# Patient Record
Sex: Female | Born: 1965 | Race: Black or African American | Hispanic: No | Marital: Single | Smoking: Never smoker
Health system: Southern US, Community
[De-identification: ages and names within clinical notes are randomized; demographics above are authoritative.]

## PROBLEM LIST (undated history)

## (undated) DIAGNOSIS — J45909 Unspecified asthma, uncomplicated: Secondary | ICD-10-CM

## (undated) DIAGNOSIS — D649 Anemia, unspecified: Secondary | ICD-10-CM

## (undated) HISTORY — PX: LAPAROSCOPIC ABDOMINAL EXPLORATION: SHX6249

## (undated) HISTORY — DX: Anemia, unspecified: D64.9

## (undated) HISTORY — DX: Unspecified asthma, uncomplicated: J45.909

## (undated) HISTORY — PX: ABDOMINAL HYSTERECTOMY: SHX81

---

## 2004-02-18 ENCOUNTER — Observation Stay (HOSPITAL_COMMUNITY): Admission: RE | Admit: 2004-02-18 | Discharge: 2004-02-19 | Payer: Self-pay | Admitting: Obstetrics and Gynecology

## 2008-12-12 ENCOUNTER — Other Ambulatory Visit: Admission: RE | Admit: 2008-12-12 | Discharge: 2008-12-12 | Payer: Self-pay | Admitting: Obstetrics and Gynecology

## 2008-12-18 ENCOUNTER — Ambulatory Visit (HOSPITAL_COMMUNITY): Admission: RE | Admit: 2008-12-18 | Discharge: 2008-12-18 | Payer: Self-pay | Admitting: Obstetrics and Gynecology

## 2009-03-26 ENCOUNTER — Encounter: Payer: Self-pay | Admitting: Obstetrics and Gynecology

## 2009-03-26 ENCOUNTER — Inpatient Hospital Stay (HOSPITAL_COMMUNITY): Admission: RE | Admit: 2009-03-26 | Discharge: 2009-03-29 | Payer: Self-pay | Admitting: Obstetrics and Gynecology

## 2010-05-24 IMAGING — US US PELVIS COMPLETE MODIFY
1 series · 13 of 25 positions shown · non-contrast
Comparison: None

CLINICAL DATA: 14-week uterine size.  Increasingly heavy menses
with anemia.  Assess fibroids.  Previous left oophorectomy for
dermoid.  LMP currently.

TRANSABDOMINAL AND TRANSVAGINAL ULTRASOUND OF PELVIS
TECHNIQUE: Both transabdominal and transvaginal ultrasound
examinations of the pelvis were performed including evaluation of
the uterus, ovaries, adnexal regions, and pelvic cul-de-sac.

[Series 1: us pelvis complete modify · 0.26mm/px · 13 of 68 slices shown]
[im 1/68]
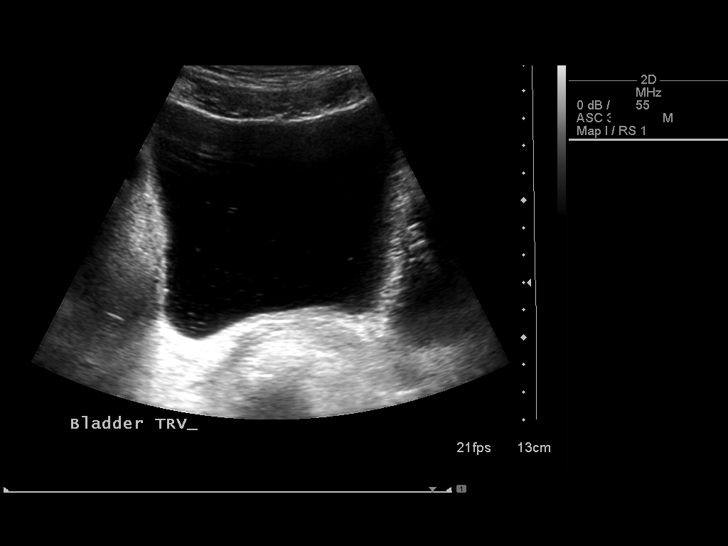
[im 6/68]
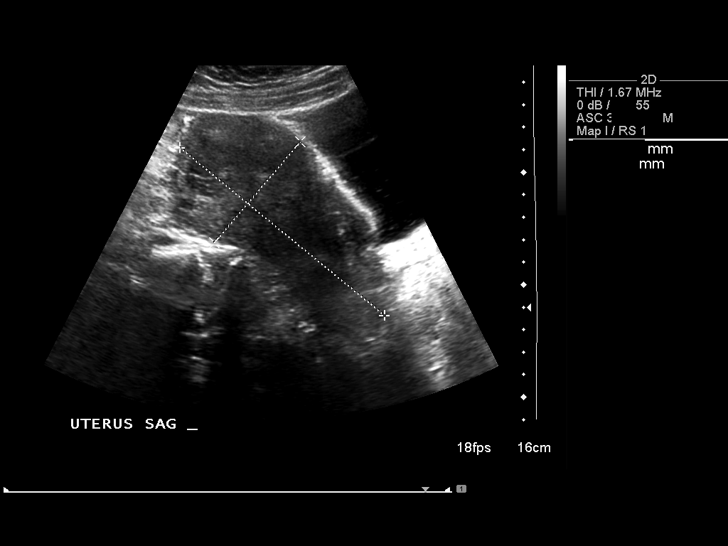
[im 12/68]
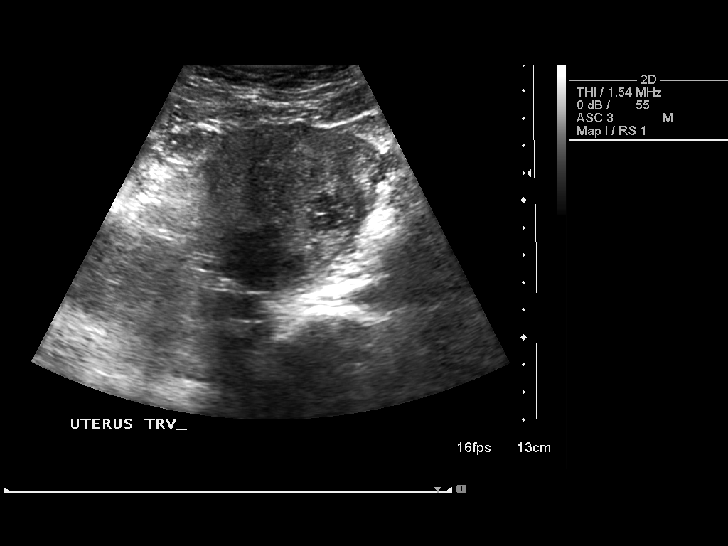
[im 17/68]
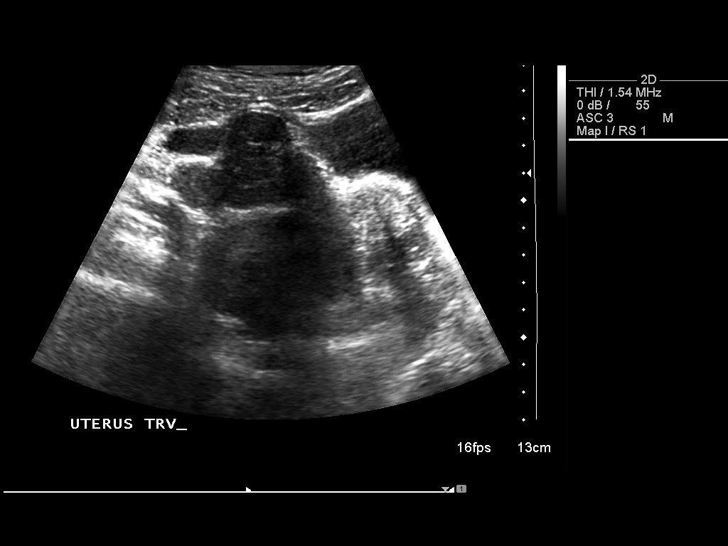
[im 23/68]
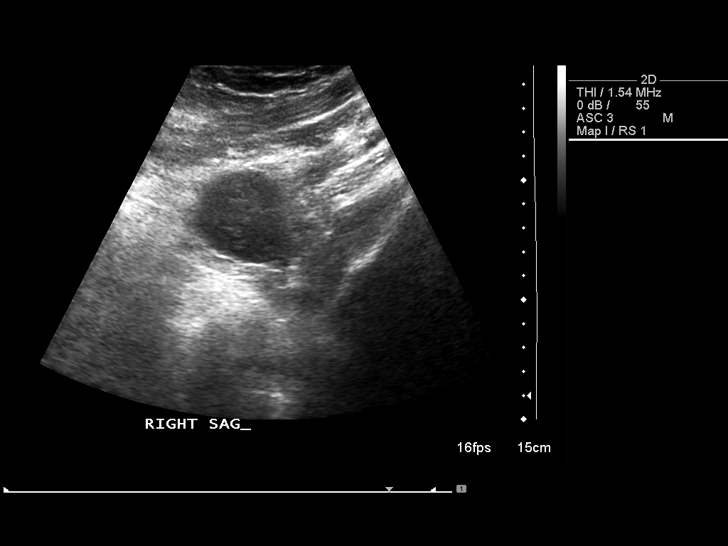
[im 28/68]
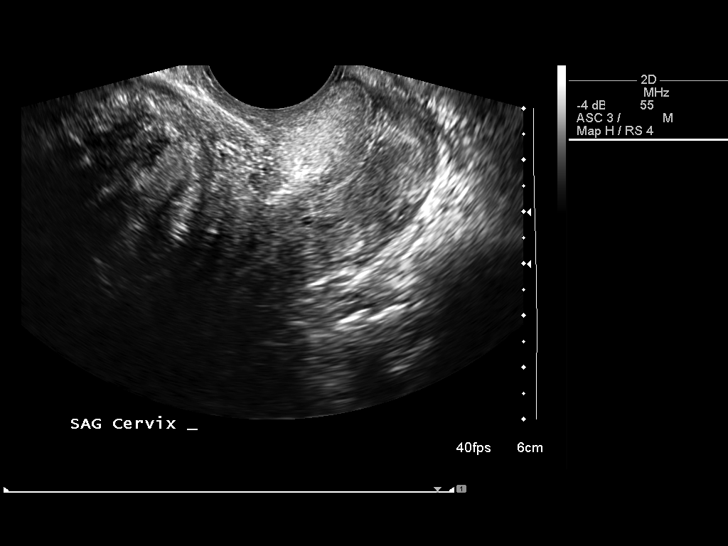
[im 34/68]
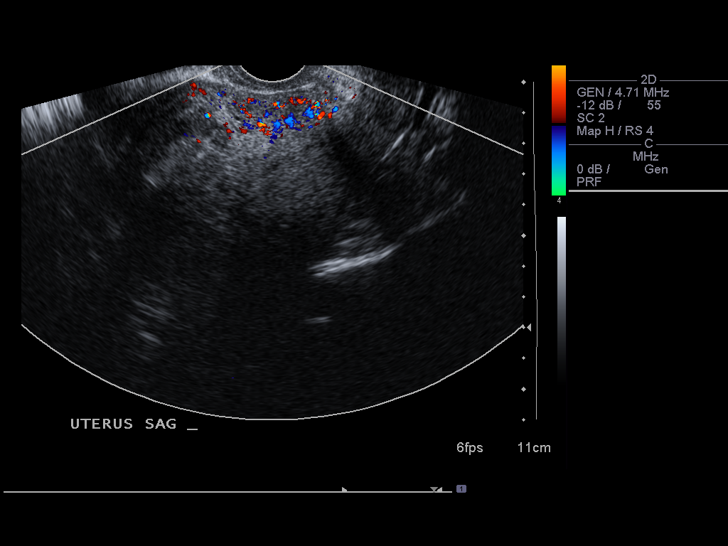
[im 40/68]
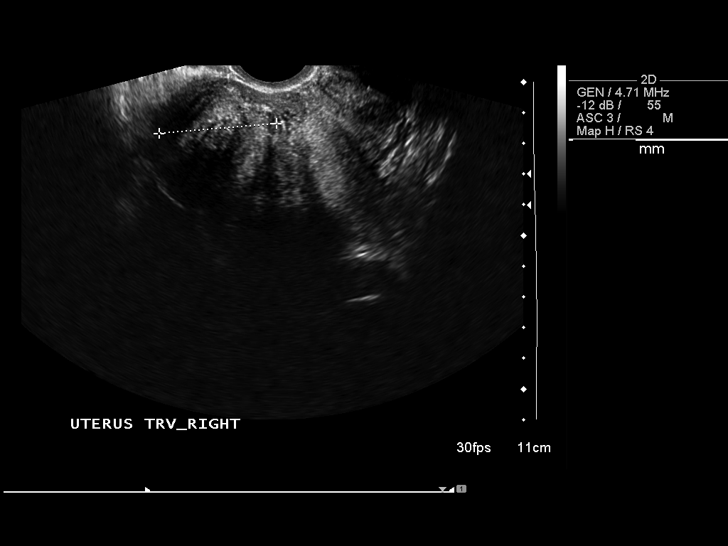
[im 45/68]
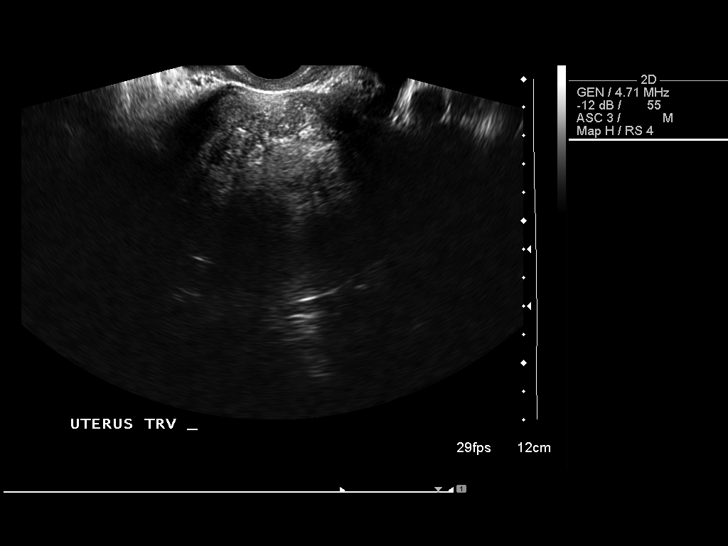
[im 51/68]
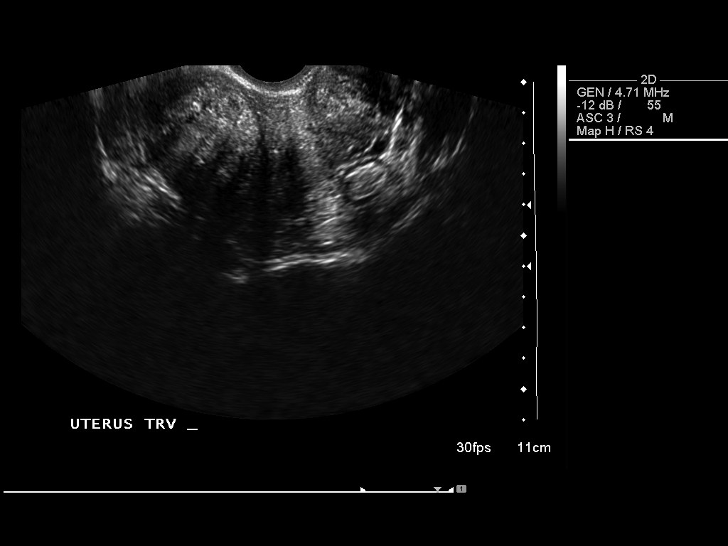
[im 56/68]
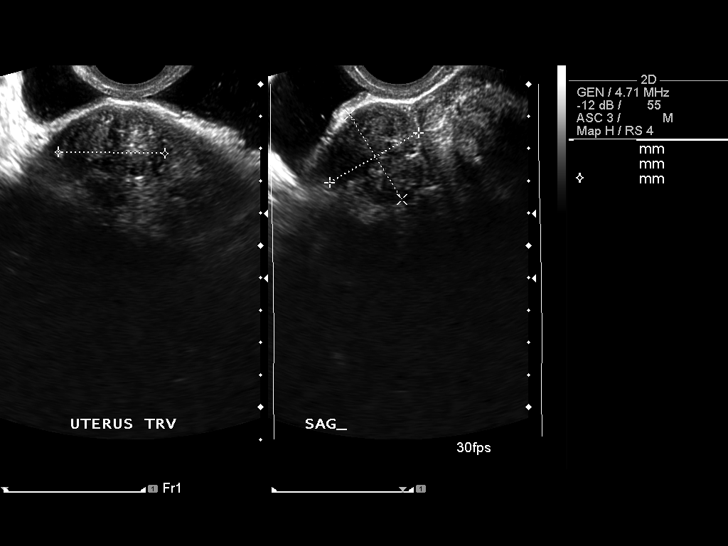
[im 62/68]
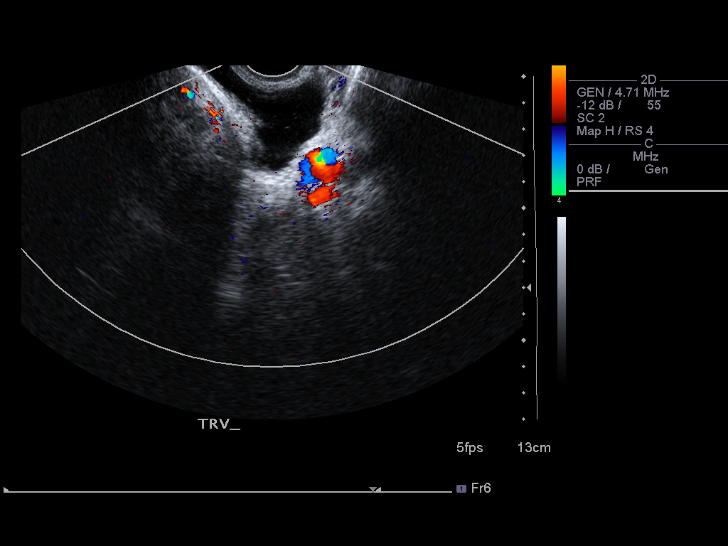
[im 68/68]
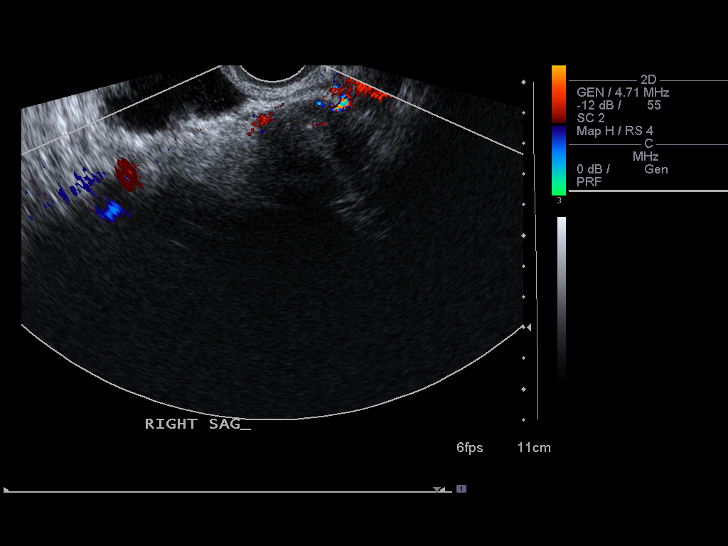

[13 of 25 positions shown; findings below may reference images not displayed]

FINDINGS: Uterus demonstrates a sagittal length of 12.7cm, an AP width of
cm and a transverse width of 9.5 cm. Multiple fibroids are
identified and the largest have sizes and locations as follows:  In
the anterior midbody with a partial subserosal component measuring
3.7 x 2.3 by 3.2 cm, in the left lateral lower uterine segment with
a partial sub serosal component measuring 3.8 x 3.0 x 3.0 cm, in
the right posterolateral upper uterine segment measuring 5.3 x
x 4.4 cm, in the anterior midbody measuring 3.2 x 3.1 x 3.3 cm.
Multiple other scattered fibroids are identified but  are difficult
to measure due to the large number present.

Endometrium poorly visualized due to the large fibroid load.
Accurate thickness and accurate delineation of the relationship
with the multiple fibroids cannot be determined on today's exam.

Right Ovary was not visualized either transabdominally or
endovaginally

Left Ovary surgically absent

Other Findings:  No pelvic fluid is seen.  Some specular echoes are
noted within the bladder and and this can be seen with
proteinaceous debris.  Clinical correlation is recommended
IMPRESSION: Fibroid uterus with fibroid sizes and locations as noted above.
There are multiple smaller fibroids identified scattered throughout
the myometrium and these cannot be accurately measured.
Endometrial lining not adequately assessed.  Non-visualized right
ovary; surgically absent left ovary.

Some echoes are identified within the fluid in the bladder and this
can be seen with proteinaceous material within the urine.  Clinical
correlation is recommended.

If further evaluation of the endometrial lining and its
relationship to the patient's fibroids is desired MRI would be
recommended for further assessment.

## 2010-06-08 LAB — CBC
MCV: 82.1 fL (ref 78.0–100.0)
RBC: 3.62 MIL/uL — ABNORMAL LOW (ref 3.87–5.11)
WBC: 7.3 10*3/uL (ref 4.0–10.5)

## 2010-06-08 LAB — BASIC METABOLIC PANEL
Chloride: 104 mEq/L (ref 96–112)
Creatinine, Ser: 0.73 mg/dL (ref 0.4–1.2)
GFR calc Af Amer: >60 mL/min (ref >60)
GFR calc non Af Amer: >60 mL/min (ref >60)
Potassium: 3.9 mEq/L (ref 3.5–5.1)

## 2010-06-08 LAB — DIFFERENTIAL
Eosinophils Absolute: 0.1 10*3/uL (ref 0.0–0.7)
Lymphocytes Relative: 19 % (ref 12–46)
Lymphs Abs: 1.4 10*3/uL (ref 0.7–4.0)
Monocytes Relative: 8 % (ref 3–12)
Neutro Abs: 5.2 10*3/uL (ref 1.7–7.7)
Neutrophils Relative %: 71 % (ref 43–77)

## 2010-06-23 LAB — CBC
HCT: 35 % — ABNORMAL LOW (ref 36.0–46.0)
Hemoglobin: 11.6 g/dL — ABNORMAL LOW (ref 12.0–15.0)
MCV: 80.7 fL (ref 78.0–100.0)
RBC: 4.34 MIL/uL (ref 3.87–5.11)
WBC: 4.5 10*3/uL (ref 4.0–10.5)

## 2010-06-23 LAB — CROSSMATCH
ABO/RH(D): O POS
Antibody Screen: NEGATIVE

## 2010-06-23 LAB — COMPREHENSIVE METABOLIC PANEL
Alkaline Phosphatase: 135 U/L — ABNORMAL HIGH (ref 39–117)
BUN: 8 mg/dL (ref 6–23)
CO2: 29 mEq/L (ref 19–32)
Chloride: 103 mEq/L (ref 96–112)
Creatinine, Ser: 0.74 mg/dL (ref 0.4–1.2)
GFR calc non Af Amer: >60 mL/min (ref >60)
Glucose, Bld: 79 mg/dL (ref 70–99)
Potassium: 3.5 mEq/L (ref 3.5–5.1)
Total Bilirubin: 0.6 mg/dL (ref 0.3–1.2)

## 2010-06-23 LAB — ABO/RH: ABO/RH(D): O POS

## 2010-08-08 NOTE — Op Note (Signed)
NAME:  Anna Logan, Anna Logan                ACCOUNT NO.:  [id number]   MEDICAL RECORD NO.:  [id number]          PATIENT TYPE:  OBV   LOCATION:  A403                          FACILITY:  [facility]   PHYSICIAN:  Tilda Burrow, M.D. DATE OF BIRTH:  February 22, 1966   DATE OF PROCEDURE:  DATE OF DISCHARGE:                                 OPERATIVE REPORT   PREOPERATIVE DIAGNOSIS:  Left lower quadrant pain, left ovarian cyst.   POSTOPERATIVE DIAGNOSIS:  Left hemorrhagic ovarian cyst (endometrioma), left  hydrosalpinx, left lower quadrant adhesions, status post tubal ligation.   PROCEDURE:  Laparoscopic left salpingo-oophorectomy and lysis of adhesions.   SURGEON:  Tilda Burrow, M.D.   ASSISTANTSimonne Come and Dearing.   ANESTHESIA:  General.   COMPLICATIONS:  None.   ESTIMATED BLOOD LOSS:  25 cc.   FINDINGS:  Thin, filmy adhesions around the appendix.  Sigmoid and omentum  attached at the site of longstanding prior tubal cautery.  A 12-week sized  uterus.  Multiple fibroids.  A large left ovarian hemorrhagic cyst and  endometrioma.  Dense adhesions under tension due to enlargement of the  uterus over time.   OPERATIVE TIME:  Around 20 minutes.   SPECIMENS:  Left tube and ovary.   DESCRIPTION OF PROCEDURE:  Patient was taken to the operating room and  prepped and draped for combined abdominal and vaginal procedure with Foley  catheter in place and Hulka tenaculum attached to the cervix for uterine  manipulation.  Latex was avoided.  The patient had an infraumbilical  vertical 1 cm skin incision made as well as a transverse suprapubic 1 cm  incision.  A Veress needle was introduced through the umbilicus.  A  pneumoperitoneum achieved easily under 11 mmHg through 2 liters CO2 with  direct insertion of laparoscopically guided umbilical trocar without  difficulty, an 11 mm trocar.  Inspection of the pelvis showed no evidence of  trauma or bleeding associated with exertion and then the  suprapubic trocar  was inserted under direct visualization with a 12 mm trocar.  Then  5 mm  trocars were placed in the right and left lower quadrants.  We then  proceeded to inspect the pelvis, where the adhesions in the right lower  quadrant around the old appendix were cut free to improve mobility.  Photo  of the appendix was taken.  The left side had omental attachments over the  site of the old tubal cautery, which was densely adherent to the back of the  thickened left round ligament.  Additionally, the uterus was drawn somewhat  to the side due to the dense adhesions.  The sigmoid colon was attached to  the side of the tubal cautery site as well.  All of this was densely  adherent and longstanding adhesions.  We were able to free up the omentum  and pull it away using unipolar cautery, as when safely available and  scissors for transection.  We were then able to inspect the bowel, and with  sharp scissor dissection, we were able to dissect the epiploic fat  attachments to the  scarred down tubal site and thereby free up the bowel.  At this time, we can visualize all the way around the adnexa.  Access was  somewhat challenging due to the enlarged fibroid uterus, but the side wall  was relatively clear.  We then proceeded to use a harmonic scalpel for most  of the remaining dissection.  Some challenges with the harmonic scalpel were  necessitated, moving to a hand-control device to get the harmonic scalpel to  work, but then we were able to dissect around the tube and ovary, removing  the left tube and ovary in one intact specimen.  Once this was free, we  placed an EndoCatch bag through the suprapubic site.  Before placing the  specimen in the bag, we were able to use a laparoscopic aspiration needle to  deflate the large hemorrhagic cyst, which drained 40 cc of old chocolate  blood from the ovary and then reduced its size.  Unipolar cautery was used  to drain the hydrosalpinx.  The  specimen was then placed in the EndoCatch  bag and taken out through the suprapubic site, which only required slight  enlargement with approximately 2 cm.  The trocar was reinserted, and  irrigation of the pelvis performed.  Photos were taken intermittently  throughout the procedure, a total of seven.  Hemostasis was confirmed to  satisfactory, and then irrigation of the pelvis copiously performed.  A  generous amount of fluid left in the abdomen.  Deflation of the abdomen and  removal of laparoscopic trocar sites was followed by closure of the  umbilical site at the level of the fascia using interrupted 0 Vicryl and  then closure of the suprapubic 2 cm incision with interrupted 0 Vicryl as  well.  Subcu tissues were loosely reapproximated and then stapling of the  skin completed the procedure.  Patient tolerated the procedure well and went  to the recovery room in good condition.  Sponge and needle counts were  correct, and stable closure of the skin had been performed.     John   JVF/MEDQ  D:  02/18/2004  T:  02/18/2004  Job:  [id number]   cc:   [facility]

## 2014-06-13 ENCOUNTER — Other Ambulatory Visit: Payer: Self-pay | Admitting: Obstetrics and Gynecology

## 2014-08-10 ENCOUNTER — Other Ambulatory Visit: Payer: Self-pay | Admitting: Obstetrics and Gynecology

## 2014-08-10 ENCOUNTER — Encounter: Payer: Self-pay | Admitting: Obstetrics and Gynecology

## 2015-01-17 ENCOUNTER — Other Ambulatory Visit: Payer: Self-pay | Admitting: Obstetrics and Gynecology

## 2015-01-17 ENCOUNTER — Encounter: Payer: Self-pay | Admitting: *Deleted

## 2015-06-17 ENCOUNTER — Other Ambulatory Visit: Payer: Self-pay | Admitting: Obstetrics and Gynecology

## 2015-07-12 ENCOUNTER — Other Ambulatory Visit: Payer: Self-pay | Admitting: Obstetrics and Gynecology

## 2015-07-12 ENCOUNTER — Encounter: Payer: Self-pay | Admitting: Obstetrics and Gynecology

## 2015-09-02 ENCOUNTER — Ambulatory Visit (INDEPENDENT_AMBULATORY_CARE_PROVIDER_SITE_OTHER): Payer: Managed Care, Other (non HMO) | Admitting: Obstetrics and Gynecology

## 2015-09-02 ENCOUNTER — Other Ambulatory Visit: Payer: Self-pay | Admitting: Obstetrics and Gynecology

## 2015-09-02 ENCOUNTER — Encounter: Payer: Self-pay | Admitting: Obstetrics and Gynecology

## 2015-09-02 VITALS — BP 170/112 | Ht 62.0 in | Wt 189.0 lb

## 2015-09-02 DIAGNOSIS — Z01419 Encounter for gynecological examination (general) (routine) without abnormal findings: Secondary | ICD-10-CM

## 2015-09-02 DIAGNOSIS — Z90721 Acquired absence of ovaries, unilateral: Principal | ICD-10-CM

## 2015-09-02 DIAGNOSIS — Z90710 Acquired absence of both cervix and uterus: Secondary | ICD-10-CM

## 2015-09-02 NOTE — Progress Notes (Signed)
Patient ID: Anna Logan, female   DOB: [birth year], 50 y.o.   MRN: [id number]   Assessment:  Annual Gyn Exam   Plan:  1. pap smear done, next pap due 3 years 2. return annually or prn 3    Annual mammogram advised 4    Fasted lab work within the next week, repeat every 3 years 5    Recommended Estrace prn to be used along with lubricant  6    Pt advised to sign up for MyChart  Subjective:  Anna Logan is a 50 y.o. female No obstetric history on file. who presents for annual exam. No LMP recorded. Patient has had a hysterectomy. Pt has no complaints today. Pt is not sexually active currently.  The following portions of the patient's history were reviewed and updated as appropriate: allergies, current medications, past family history, past medical history, past social history, past surgical history and problem list.  Past Medical History  Diagnosis Date  . Anemia   . Asthma     Past Surgical History  Procedure Laterality Date  . Abdominal hysterectomy    . Laparoscopic abdominal exploration       Current outpatient prescriptions:  .  Omega-3 Fatty Acids (FISH OIL) 500 MG CAPS, Take 2 capsules by mouth daily., Disp: , Rfl:  .  Phenylephrine-Acetaminophen 5-325 MG CAPS, Take 2 tablets by mouth as needed., Disp: , Rfl:   Review of Systems Constitutional: negative Gastrointestinal: negative Genitourinary: negative  Objective:  BP 170/112 mmHg  Ht 5\' 2"  (1.575 m)  Wt 189 lb (85.73 kg)  BMI 34.56 kg/m2   BMI: Body mass index is 34.56 kg/(m^2).  General Appearance: Alert, appropriate appearance for age. No acute distress HEENT: Grossly normal Neck / Thyroid:  Cardiovascular: RRR; normal S1, S2, no murmur Lungs: CTA bilaterally Back: No CVAT Breast Exam: No dimpling, nipple retraction or discharge. No masses or nodes. Good tissue support. Gastrointestinal: Soft, non-tender, no masses or organomegaly Pelvic Exam: Vaginal: normal mucosa without prolapse or lesions,  normal without tenderness, induration or masses and normal rugae; good anterior and posterior support Cervix: absent, removed surgically Adnexa: absent, removed surgically Uterus: absent, removed surgically Rectal: good sphincter tone, no masses and guaiac negative Lymphatic Exam: Non-palpable nodes in neck, clavicular, axillary, or inguinal regions  Skin: no rash or abnormalities Neurologic: Normal gait and speech, no tremor  Psychiatric: Alert and oriented, appropriate affect.  Urinalysis: not done Guaiac negative  Christin BachJohn Korver Graybeal. MD Pgr [phone] 11:10 AM  By signing my name below, I, Marisue HumbleMichelle Chaffee, attest that this documentation has been prepared under the direction and in the presence of Tilda BurrowJohn V Tzirel Leonor, MD . Electronically Signed: Marisue HumbleMichelle Chaffee, Scribe. 09/02/2015. 11:03 AM.  I personally performed the services described in this documentation, which was SCRIBED in my presence. The recorded information has been reviewed and considered accurate. It has been edited as necessary during review. Tilda BurrowFERGUSON,Timothy Townsel V, MD

## 2015-09-02 NOTE — Progress Notes (Signed)
Patient ID: Anna LewandowskyCarmen R Strassman, female   DOB: 09/17/1965, 50 y.o.   MRN: [id number] Pt here today for annual exam. Pt has had a hysterectomy. Pt denies any problems or concerns at this time. Pt would like to have blood drawn as well.

## 2015-09-03 LAB — CBC WITH DIFFERENTIAL/PLATELET
BASOS ABS: 0 10*3/uL (ref 0.0–0.2)
Basos: 1 %
EOS (ABSOLUTE): 0.2 10*3/uL (ref 0.0–0.4)
Eos: 5 %
HEMATOCRIT: 35 % (ref 34.0–46.6)
HEMOGLOBIN: 11.5 g/dL (ref 11.1–15.9)
Immature Grans (Abs): 0 10*3/uL (ref 0.0–0.1)
Immature Granulocytes: 0 %
LYMPHS ABS: 2 10*3/uL (ref 0.7–3.1)
Lymphs: 41 %
MCH: 28 pg (ref 26.6–33.0)
MCHC: 32.9 g/dL (ref 31.5–35.7)
MCV: 85 fL (ref 79–97)
MONOS ABS: 0.4 10*3/uL (ref 0.1–0.9)
Monocytes: 8 %
NEUTROS ABS: 2.2 10*3/uL (ref 1.4–7.0)
Neutrophils: 45 %
Platelets: 336 10*3/uL (ref 150–379)
RBC: 4.1 x10E6/uL (ref 3.77–5.28)
RDW: 14.7 % (ref 12.3–15.4)
WBC: 4.8 10*3/uL (ref 3.4–10.8)

## 2015-09-03 LAB — TSH: TSH: 0.909 u[IU]/mL (ref 0.450–4.500)

## 2015-09-03 LAB — COMPREHENSIVE METABOLIC PANEL
A/G RATIO: 1.1 — AB (ref 1.2–2.2)
ALBUMIN: 3.8 g/dL (ref 3.5–5.5)
ALK PHOS: 161 IU/L — AB (ref 39–117)
ALT: 17 IU/L (ref 0–32)
AST: 18 IU/L (ref 0–40)
BILIRUBIN TOTAL: 0.3 mg/dL (ref 0.0–1.2)
BUN / CREAT RATIO: 15 (ref 9–23)
BUN: 13 mg/dL (ref 6–24)
CHLORIDE: 99 mmol/L (ref 96–106)
CO2: 24 mmol/L (ref 18–29)
Calcium: 10.8 mg/dL — ABNORMAL HIGH (ref 8.7–10.2)
Creatinine, Ser: 0.87 mg/dL (ref 0.57–1.00)
GFR calc non Af Amer: 77 mL/min/{1.73_m2} (ref >59)
GFR, EST AFRICAN AMERICAN: 89 mL/min/{1.73_m2} (ref >59)
GLOBULIN, TOTAL: 3.6 g/dL (ref 1.5–4.5)
Glucose: 146 mg/dL — ABNORMAL HIGH (ref 65–99)
Potassium: 4.3 mmol/L (ref 3.5–5.2)
SODIUM: 136 mmol/L (ref 134–144)
TOTAL PROTEIN: 7.4 g/dL (ref 6.0–8.5)

## 2015-09-03 LAB — LIPID PANEL
CHOLESTEROL TOTAL: 126 mg/dL (ref 100–199)
Chol/HDL Ratio: 2.6 ratio units (ref 0.0–4.4)
HDL: 48 mg/dL (ref >39)
LDL CALC: 53 mg/dL (ref 0–99)
Triglycerides: 123 mg/dL (ref 0–149)
VLDL CHOLESTEROL CAL: 25 mg/dL (ref 5–40)

## 2015-09-09 ENCOUNTER — Telehealth: Payer: Self-pay | Admitting: Obstetrics and Gynecology

## 2015-09-09 NOTE — Telephone Encounter (Signed)
Dr. Emelda FearFerguson, will you please review her CMP and let me know what you want me to tell her and I will call her.  Thanks!

## 2015-09-10 NOTE — Telephone Encounter (Signed)
Cholesterol outstandingly good kidney function and liver function normal good hemoglobin

## 2015-09-11 NOTE — Telephone Encounter (Signed)
Pt informed of Dr. Emelda FearFerguson comments in regards to lab results

## 2016-09-04 ENCOUNTER — Encounter: Payer: Self-pay | Admitting: Obstetrics and Gynecology

## 2016-09-04 ENCOUNTER — Other Ambulatory Visit: Payer: Managed Care, Other (non HMO) | Admitting: Obstetrics and Gynecology

## 2016-12-28 ENCOUNTER — Ambulatory Visit (INDEPENDENT_AMBULATORY_CARE_PROVIDER_SITE_OTHER): Payer: 59 | Admitting: Obstetrics and Gynecology

## 2016-12-28 ENCOUNTER — Telehealth: Payer: Self-pay | Admitting: *Deleted

## 2016-12-28 ENCOUNTER — Encounter: Payer: Self-pay | Admitting: Obstetrics and Gynecology

## 2016-12-28 ENCOUNTER — Encounter (INDEPENDENT_AMBULATORY_CARE_PROVIDER_SITE_OTHER): Payer: Self-pay

## 2016-12-28 DIAGNOSIS — Z01419 Encounter for gynecological examination (general) (routine) without abnormal findings: Secondary | ICD-10-CM | POA: Diagnosis not present

## 2016-12-28 DIAGNOSIS — Z1212 Encounter for screening for malignant neoplasm of rectum: Secondary | ICD-10-CM

## 2016-12-28 DIAGNOSIS — Z1211 Encounter for screening for malignant neoplasm of colon: Secondary | ICD-10-CM

## 2016-12-28 LAB — HEMOCCULT GUIAC POC 1CARD (OFFICE): Fecal Occult Blood, POC: NEGATIVE

## 2016-12-28 NOTE — Progress Notes (Signed)
  Assessment:  Annual Gyn Exam Status post hysterectomy Plan:  1. pap smear done on 09/02/15, No more REQUIRED due to removal of cervix 2. return as needed but patient advised to find primary care and area. Normal screening exams discussed with son, including colonoscopy at age 51 which needs to be done, DEXA scan at age 89,, and mammograms annually until age 26 and then as recommended at that time  Subjective:  Anna Logan is a 51 y.o. female No obstetric history on file. who presents for annual exam. No LMP recorded. Patient has had a hysterectomy. With removal of cervix and allegedly removal of adnexa The patient has no complaints today. Pt has not had a colonoscopy. Pt does not have a PCP where she lives currently [place], [place]).She is here with her son who is from [place] and is taken time off to ensure that the patient is taking care of herself Pt is not taking any medications. Pt does experience hot sweats. Pt is not having any urgency or frequency with urination.  The following portions of the patient's history were reviewed and updated as appropriate: allergies, current medications, past family history, past medical history, past social history, past surgical history and problem list. Past Medical History:  Diagnosis Date  . Anemia   . Asthma     Past Surgical History:  Procedure Laterality Date  . ABDOMINAL HYSTERECTOMY    . LAPAROSCOPIC ABDOMINAL EXPLORATION      No current outpatient prescriptions on file.  Review of Systems Constitutional: negative Gastrointestinal: negative Genitourinary: negative  Objective:  BP (!) 180/80 (BP Location: Right Arm, Patient Position: Sitting, Cuff Size: Normal)   Pulse 70   Ht 5' 5.5" (1.664 m)   Wt 199 lb (90.3 kg)   BMI 32.61 kg/m    BMI: Body mass index is 32.61 kg/m.  General Appearance: Alert, appropriate appearance for age. No acute distress HEENT: Grossly normal Breast Exam: No masses or nodes.No dimpling, nipple  retraction or discharge. Gastrointestinal: Soft, non-tender, no masses or organomegaly Pelvic Exam:  External genitalia: normal general appearance Vaginal: good support, no cystocele Cervix: absent, removed surgically Adnexa: absent, removed surgically Uterus: absent, removed surgically RECTAL: guaiac negative stool obtained.  Rectovaginal: normal rectal, no masses Skin: no rash or abnormalities Neurologic: Normal gait and speech, no tremor  Psychiatric: Alert and oriented, appropriate affect.  Urinalysis:Not done  Christin Bach. MD Pgr [phone] 11:50 AM    By signing my name below, I, Redge Gainer, attest that this documentation has been prepared under the direction and in the presence of Tilda Burrow, MD. Electronically Signed: Redge Gainer, Medical Scribe. 12/28/16. 11:50 AM.  I personally performed the services described in this documentation, which was SCRIBED in my presence. The recorded information has been reviewed and considered accurate. It has been edited as necessary during review. Tilda Burrow, MD

## 2016-12-28 NOTE — Telephone Encounter (Signed)
Left message x 1. JSY 

## 2018-10-17 ENCOUNTER — Telehealth: Payer: Self-pay | Admitting: Obstetrics and Gynecology

## 2018-10-17 NOTE — Telephone Encounter (Signed)
Patient called stating that Dr. Glo Herring gave her a call and told her to speak with one of his nurses. Pt did not state the reason why. Please contact pt

## 2018-10-18 NOTE — Telephone Encounter (Signed)
I called patient back. Anna Logan is living in [place] now. Anna Logan reports that Anna Logan has a cyst in the area that her left ovary was. Her PCP wanted to refer her to an obgyn. Anna Logan told him that Anna Logan already had one. Anna Logan will be traveling to see Dr. Glo Herring, Anna Logan is going to have her records sent to Dr. Glo Herring to review prior to her visit. Pt will have records sent, and I transferred her to the front desk.

## 2018-11-01 ENCOUNTER — Telehealth: Payer: Self-pay | Admitting: Obstetrics and Gynecology

## 2018-11-01 NOTE — Telephone Encounter (Signed)
Unable to reach pt with restrictions.  

## 2018-11-02 ENCOUNTER — Ambulatory Visit (INDEPENDENT_AMBULATORY_CARE_PROVIDER_SITE_OTHER): Payer: 59 | Admitting: Obstetrics and Gynecology

## 2018-11-02 ENCOUNTER — Encounter: Payer: Self-pay | Admitting: Obstetrics and Gynecology

## 2018-11-02 ENCOUNTER — Other Ambulatory Visit: Payer: Self-pay

## 2018-11-02 VITALS — BP 180/100 | HR 74 | Ht 62.0 in | Wt 204.0 lb

## 2018-11-02 DIAGNOSIS — N83202 Unspecified ovarian cyst, left side: Secondary | ICD-10-CM | POA: Diagnosis not present

## 2018-11-02 NOTE — Progress Notes (Signed)
Patient ID: [name], female   DOB: [birth year], 53 y.o.   MRN: [id number]    [facility] Visit  @DATE @            Patient name: Anna Logan MRN [id number]  Date of birth: 02/12/1966  CC & HPI:  Anna Logan is a 53 y.o. female presenting today for cyst on left adnexa. S/p hysterectomy with Right salpingoophorectomy 2011,  And laparoscopic  Left salpingo oophorectomy. In 2005 Had laparoscopic diagnostic testing, and removal of LEFT ovary and TUBE before hysterectomy.  Records of the laparoscopic left salpingo-oophorectomy are reviewed and they were notable for extensive thin adhesions.  Pathology report reads left tube and ovary, though partial retained ovary or is not able to be ruled out by this report  Went to doc for kidney stone and she had passed the kidney stones. Doctor ordered u/s to make sure she had passed kidney stone and found mass in adnexa. Cyst is 10 cm. Denies any pain or sexual activity  ROS:  ROS +left adnexal cyst -pelvic pain -fever -nausea  All systems are negative except as noted in the HPI and PMH.    Pertinent History Reviewed:   Reviewed: Significant for hysterectomy Medical         Past Medical History:  Diagnosis Date  . Anemia   . Asthma                               Surgical Hx:    Past Surgical History:  Procedure Laterality Date  . ABDOMINAL HYSTERECTOMY    . LAPAROSCOPIC ABDOMINAL EXPLORATION     Medications: Reviewed & Updated - see associated section                       Current Outpatient Medications:  Marland Kitchen  Multiple Vitamin (MULTIVITAMIN) tablet, Take by mouth., Disp: , Rfl:  .  Omega-3 Fatty Acids (FISH OIL) 1000 MG CAPS, Take by mouth., Disp: , Rfl:  .  telmisartan (MICARDIS) 80 MG tablet, TAKE 1 TABLET(80 MG) BY MOUTH DAILY, Disp: , Rfl:  .  Vitamin D, Ergocalciferol, (DRISDOL) 1.25 MG (50000 UT) CAPS capsule, TK 1 C PO Q WK, Disp: , Rfl:    Social History: Reviewed -  reports that she has never smoked. She has never  used smokeless tobacco.  Objective Findings:  Vitals: Blood pressure (!) 180/100, pulse 74, height 5\' 2"  (1.575 m), weight 204 lb (92.5 kg).  PHYSICAL EXAMINATION General appearance - alert, well appearing, and in no distress Mental status - alert, oriented to person, place, and time, normal mood, behavior, speech, dress, motor activity, and thought processes, affect appropriate to mood   PELVIC Vagina - nml apperance Cervix - surgically absent Uterus - surgically absent Adnexa - non-tender   Assessment & Plan:   A:  1. Left adnexal cyst, suspect surgical scarring  2. Chronic HTN poor control 3. Mild renal insufficiency creatinine 1.18  P:  1. Schedule TV u/s 2 weeks 2. CA 125 3. Discussed kidney function as relates to HTN and importance of compliance with blood pressure medicines    By signing my name below, I, Samul Dada, attest that this documentation has been prepared under the direction and in the presence of Jonnie Kind, MD. Electronically Signed: Samul Dada Medical Scribe. 11/02/18. 2:43 PM.  I personally performed the services described in this documentation, which was SCRIBED  in my presence. The recorded information has been reviewed and considered accurate. It has been edited as necessary during review. Jonnie Kind, MD

## 2018-11-03 LAB — CA 125: Cancer Antigen (CA) 125: 8 U/mL (ref 0.0–38.1)

## 2018-11-04 ENCOUNTER — Telehealth: Payer: Self-pay | Admitting: Obstetrics and Gynecology

## 2018-11-04 NOTE — Telephone Encounter (Signed)
Pt informed of reassuring Ca 125, pt asked to still get u/s to complete w.u

## 2018-11-15 ENCOUNTER — Other Ambulatory Visit: Payer: Self-pay | Admitting: Obstetrics and Gynecology

## 2018-11-15 ENCOUNTER — Telehealth: Payer: Self-pay | Admitting: Obstetrics and Gynecology

## 2018-11-15 DIAGNOSIS — N83202 Unspecified ovarian cyst, left side: Secondary | ICD-10-CM

## 2018-11-15 NOTE — Telephone Encounter (Signed)
Called patient and left message informing her that we are not allowing any visitors or children to come to visit with her at this time and we are requiring a mask to be worn during the visit. Asked if she has had any exposure to anyone suspected or confirmed of having COVID-19 or if she is experiencing any of the following: fever, cough, sob, muscle pain, severe headache, sore throat, diarrhea, loss of taste or smell or ear, nose or throat discomfort to call and reschedule.   °

## 2018-11-16 ENCOUNTER — Ambulatory Visit (INDEPENDENT_AMBULATORY_CARE_PROVIDER_SITE_OTHER): Payer: 59 | Admitting: Obstetrics and Gynecology

## 2018-11-16 ENCOUNTER — Ambulatory Visit (INDEPENDENT_AMBULATORY_CARE_PROVIDER_SITE_OTHER): Payer: 59

## 2018-11-16 ENCOUNTER — Encounter: Payer: Self-pay | Admitting: Obstetrics and Gynecology

## 2018-11-16 ENCOUNTER — Other Ambulatory Visit: Payer: Self-pay

## 2018-11-16 VITALS — BP 184/92 | HR 68 | Ht 62.0 in | Wt 202.4 lb

## 2018-11-16 DIAGNOSIS — Z90710 Acquired absence of both cervix and uterus: Secondary | ICD-10-CM | POA: Diagnosis not present

## 2018-11-16 DIAGNOSIS — N83202 Unspecified ovarian cyst, left side: Secondary | ICD-10-CM | POA: Diagnosis not present

## 2018-11-16 DIAGNOSIS — K668 Other specified disorders of peritoneum: Secondary | ICD-10-CM | POA: Insufficient documentation

## 2018-11-16 DIAGNOSIS — Z90721 Acquired absence of ovaries, unilateral: Secondary | ICD-10-CM | POA: Diagnosis not present

## 2018-11-16 NOTE — Progress Notes (Addendum)
Patient ID: JOHARI BENNETTS, female   DOB: Oct 05, 1965, 53 y.o.   MRN: [id number]    [facility] Visit  @DATE @            Patient name: KAILEN NAME MRN [id number]  Date of birth: 1965/12/28  CC & HPI:  MELONDY BLANCHARD is a 53 y.o. female presenting today for gyn f/u of u/s. PELVIC US TA :normal vaginal cuff,normal right adnexa,10.1 x 9.2 x 6.6 cm complex irregular multi loculated cyst w/septation,septation thickness 2.2-2.8 mm,no free fluid,no pain over cyst.  Is in no pain. Tube and ovary removed before hysterectomy  ROS:  ROS +adnexal mass -abdominal/pelvic pain -fever -vaginal bleeding  All systems are negative except as noted in the HPI and PMH.    Pertinent History Reviewed:   Reviewed:  Medical         Past Medical History:  Diagnosis Date  . Anemia   . Asthma                               Surgical Hx:    Past Surgical History:  Procedure Laterality Date  . ABDOMINAL HYSTERECTOMY    . LAPAROSCOPIC ABDOMINAL EXPLORATION     Medications: Reviewed & Updated - see associated section                       Current Outpatient Medications:  Marland Kitchen  Multiple Vitamin (MULTIVITAMIN) tablet, Take by mouth., Disp: , Rfl:  .  Omega-3 Fatty Acids (FISH OIL) 1000 MG CAPS, Take by mouth., Disp: , Rfl:  .  telmisartan (MICARDIS) 80 MG tablet, TAKE 1 TABLET(80 MG) BY MOUTH DAILY, Disp: , Rfl:  .  Vitamin D, Ergocalciferol, (DRISDOL) 1.25 MG (50000 UT) CAPS capsule, TK 1 C PO Q WK, Disp: , Rfl:    Social History: Reviewed -  reports that she has never smoked. She has never used smokeless tobacco.  Objective Findings:  Vitals: There were no vitals taken for this visit.  PHYSICAL EXAMINATION General appearance - alert, well appearing, and in no distress Mental status - alert, oriented to person, place, and time, normal mood, behavior, speech, dress, motor activity, and thought processes, affect appropriate to mood  PELVIC Discussion only U/s:GYNECOLOGIC SONOGRAM   DANYLE BOENING is a 53 y.o. [phone] s/p total hysterectomy,she is here for a pelvic sonogram for a left adnexal cyst.  Uterus                     Surgically removed,normal vaginal cuff  Endometrium         N/A  Right ovary             Surgically removed,right adnexa wnl  Left ovary                Surgically removed,10.1 x 9.2 x 6.6 cm complex, irregular, multi. loculated cyst w/septations,septation thickness 2.2-2.8 mm  No free fluid   Technician Comments:  PELVIC US TA :normal vaginal cuff,normal right adnexa,10.1 x 9.2 x 6.6 cm complex irregular multi loculated cyst w/septations,septation thickness 2.2-2.8 mm,no free fluid,no pain over cyst    Silver Huguenin 11/16/2018 3:20 PM  Clinical Impression and recommendations:  I have reviewed the sonogram results above. Well documented left salpingo oophorectomy, then hysterectomy.  Pt assymptomatic.    Combined with the patient's current clinical course, below are my impressions and any appropriate recommendations  for management based on the sonographic findings:   FINDINGS: postoperative peritoneal inclusion cysts. Assymptomatic.   I IMPRESSION:  Tilda BurrowJohn V Talyn Eddie 11/16/2018 Assessment & Plan:   A:  1.  peritoneal Adhesions assymptomatic.  P:  1.  Reassess periodically 2. F/u PRN    By signing my name below, I, Arnette NorrisMari Johnson, attest that this documentation has been prepared under the direction and in the presence of Tilda BurrowFerguson, Lathon Adan V, MD. Electronically Signed: Arnette NorrisMari Johnson Medical Scribe. 11/16/18. 3:09 PM.  I personally performed the services described in this documentation, which was SCRIBED in my presence. The recorded information has been reviewed and considered accurate. It has been edited as necessary during review. Tilda BurrowJohn V Megann Easterwood, MD

## 2018-11-16 NOTE — Progress Notes (Signed)
Patient ID: Anna Logan, female   DOB: Dec 05, 1965, 53 y.o.   MRN: [id number] PELVIC US TA :normal vaginal cuff,normal right adnexa,10.1 x 9.2 x 6.6 cm complex irregular multi loculated cyst w/septation,septation thickness 2.2-2.8 mm,no free fluid,no pain over cyst

## 2021-05-19 ENCOUNTER — Other Ambulatory Visit: Payer: 59 | Admitting: Adult Health
